# Patient Record
Sex: Male | Born: 1990 | Hispanic: No | Marital: Single | Smoking: Current some day smoker
Health system: Southern US, Community
[De-identification: ages and names within clinical notes are randomized; demographics above are authoritative.]

---
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
## 2012-02-03 ENCOUNTER — Ambulatory Visit (INDEPENDENT_AMBULATORY_CARE_PROVIDER_SITE_OTHER): Payer: Commercial Managed Care - PPO | Admitting: Family Medicine

## 2012-02-03 VITALS — BP 108/65 | HR 59 | Temp 98.0°F | Resp 16 | Ht 72.0 in | Wt 189.0 lb

## 2012-02-03 DIAGNOSIS — T6391XA Toxic effect of contact with unspecified venomous animal, accidental (unintentional), initial encounter: Secondary | ICD-10-CM

## 2012-02-03 DIAGNOSIS — T63301A Toxic effect of unspecified spider venom, accidental (unintentional), initial encounter: Secondary | ICD-10-CM

## 2012-02-03 NOTE — Progress Notes (Signed)
21 year old man who experienced what he thinks is a spider bite several days ago on his right lower extremity. He woke with some itching in the area and had subsequent ulceration in 2 different areas. He appears to be healing now but he was concerned after reading the Internet indication of brown recluse spider bites. RAFA objective  Objective: No acute distress Examination the right lower extremity shows 2 small healing ulcerations with crusting scab on each. These measure approximately 2 x 4 mm in her surrounded by a 2 mm area of erythema without streaking. There is no induration or significant swelling in the area.  Assessment:, Spider bite with normal subsequent healing  Plan: Continue washing with soap and water, if symptoms worsen for any reason instead of the continued healing, start doxycycline 100 twice a day x7

## 2013-10-14 ENCOUNTER — Ambulatory Visit: Payer: Commercial Managed Care - PPO

## 2013-10-14 ENCOUNTER — Ambulatory Visit (INDEPENDENT_AMBULATORY_CARE_PROVIDER_SITE_OTHER): Payer: Commercial Managed Care - PPO | Admitting: Emergency Medicine

## 2013-10-14 VITALS — BP 128/80 | HR 66 | Temp 97.9°F | Resp 16 | Ht 72.0 in | Wt 179.8 lb

## 2013-10-14 DIAGNOSIS — R079 Chest pain, unspecified: Secondary | ICD-10-CM

## 2013-10-14 NOTE — Progress Notes (Signed)
[facility] [address], [place] [address] [phone]  Date:  10/14/2013   Name:  Gary Ball   DOB:  1991/05/31   MRN:  [id number]  PCP:  No primary provider on file.    Chief Complaint: Chest Pain   History of Present Illness:  Gary Ball is a 23 y.o. very pleasant male patient who presents with the following:  Describes infrequent right chest pains says they are occasionally dull.  When he gets the pains, they come in waves and come and go.  Last 3-4 minutes and pass. Not associated with activity, shortness of breath.  No nausea or vomiting.  No wheezing.  No diaphoresis or palpitations or rapid heart rate.  No relation to activity.  No history of injury or overuse.  Occasional smoker, daily marijuana user.  No GI symptoms.  No hypertension, lipid elevation, DM or premature heart disease in family.  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  No improvement with over the counter medications or other home remedies. Denies other complaint or health concern today.   There are no active problems to display for this patient.   History reviewed. No pertinent past medical history.  History reviewed. No pertinent past surgical history.  History  Substance Use Topics  . Smoking status: Current Some Day Smoker -- 0.10 packs/day for 3 years    Types: Cigarettes  . Smokeless tobacco: Not on file  . Alcohol Use: 0.6 oz/week    1 Cans of beer per week    Family History  Problem Relation Age of Onset  . Cancer Maternal Grandfather   . Cancer Paternal Grandfather     No Known Allergies  Medication list has been reviewed and updated.  No current outpatient prescriptions on file prior to visit.   No current facility-administered medications on file prior to visit.    Review of Systems:  As per HPI, otherwise negative.    Physical Examination: Filed Vitals:   10/14/13 1503  BP: 128/80  Pulse: 66  Temp: 97.9 F (36.6 C)  Resp: 16    Filed Vitals:   10/14/13 1503  Height: 6' (1.829 m)  Weight: 179 lb 12.8 oz (81.557 kg)   Body mass index is 24.38 kg/(m^2). Ideal Body Weight: Weight in (lb) to have BMI = 25: 183.9  GEN: WDWN, NAD, Non-toxic, A & O x 3 HEENT: Atraumatic, Normocephalic. Neck supple. No masses, No LAD. Ears and Nose: No external deformity. CV: RRR, No M/G/R. No JVD. No thrill. No extra heart sounds. PULM: CTA B, no wheezes, crackles, rhonchi. No retractions. No resp. distress. No accessory muscle use. ABD: S, NT, ND, +BS. No rebound. No HSM. EXTR: No c/c/e NEURO Normal gait.  PSYCH: Normally interactive. Conversant. Not depressed or anxious appearing.  Calm demeanor.    Assessment and Plan: Atypical chest pain Offered cardiology referral and refused Stop smoking   Signed,  Phillips OdorJeffery Kasiya Burck, MD   [facility] reading (PRIMARY) by  Dr. Dareen PianoAnderson.  Negative chest.

## 2013-10-14 NOTE — Patient Instructions (Signed)
Chest Pain (Nonspecific) °It is often hard to give a specific diagnosis for the cause of chest pain. There is always a chance that your pain could be related to something serious, such as a heart attack or a blood clot in the lungs. You need to follow up with your caregiver for further evaluation. °CAUSES  °· Heartburn. °· Pneumonia or bronchitis. °· Anxiety or stress. °· Inflammation around your heart (pericarditis) or lung (pleuritis or pleurisy). °· A blood clot in the lung. °· A collapsed lung (pneumothorax). It can develop suddenly on its own (spontaneous pneumothorax) or from injury (trauma) to the chest. °· Shingles infection (herpes zoster virus). °The chest wall is composed of bones, muscles, and cartilage. Any of these can be the source of the pain. °· The bones can be bruised by injury. °· The muscles or cartilage can be strained by coughing or overwork. °· The cartilage can be affected by inflammation and become sore (costochondritis). °DIAGNOSIS  °Lab tests or other studies, such as X-rays, electrocardiography, stress testing, or cardiac imaging, may be needed to find the cause of your pain.  °TREATMENT  °· Treatment depends on what may be causing your chest pain. Treatment may include: °· Acid blockers for heartburn. °· Anti-inflammatory medicine. °· Pain medicine for inflammatory conditions. °· Antibiotics if an infection is present. °· You may be advised to change lifestyle habits. This includes stopping smoking and avoiding alcohol, caffeine, and chocolate. °· You may be advised to keep your head raised (elevated) when sleeping. This reduces the chance of acid going backward from your stomach into your esophagus. °· Most of the time, nonspecific chest pain will improve within 2 to 3 days with rest and mild pain medicine. °HOME CARE INSTRUCTIONS  °· If antibiotics were prescribed, take your antibiotics as directed. Finish them even if you start to feel better. °· For the next few days, avoid physical  activities that bring on chest pain. Continue physical activities as directed. °· Do not smoke. °· Avoid drinking alcohol. °· Only take over-the-counter or prescription medicine for pain, discomfort, or fever as directed by your caregiver. °· Follow your caregiver's suggestions for further testing if your chest pain does not go away. °· Keep any follow-up appointments you made. If you do not go to an appointment, you could develop lasting (chronic) problems with pain. If there is any problem keeping an appointment, you must call to reschedule. °SEEK MEDICAL CARE IF:  °· You think you are having problems from the medicine you are taking. Read your medicine instructions carefully. °· Your chest pain does not go away, even after treatment. °· You develop a rash with blisters on your chest. °SEEK IMMEDIATE MEDICAL CARE IF:  °· You have increased chest pain or pain that spreads to your arm, neck, jaw, back, or abdomen. °· You develop shortness of breath, an increasing cough, or you are coughing up blood. °· You have severe back or abdominal pain, feel nauseous, or vomit. °· You develop severe weakness, fainting, or chills. °· You have a fever. °THIS IS AN EMERGENCY. Do not wait to see if the pain will go away. Get medical help at once. Call your local emergency services (911 in [place] Do not drive yourself to the hospital. °MAKE SURE YOU:  °· Understand these instructions. °· Will watch your condition. °· Will get help right away if you are not doing well or get worse. °Document Released: 05/29/2005 Document Revised: 11/11/2011 Document Reviewed: 03/24/2008 °[employer]® Patient Information ©2014 [employer]. ° °

## 2014-07-25 ENCOUNTER — Other Ambulatory Visit: Payer: Self-pay | Admitting: Endocrinology

## 2014-07-25 DIAGNOSIS — G4489 Other headache syndrome: Secondary | ICD-10-CM

## 2014-07-26 ENCOUNTER — Ambulatory Visit
Admission: RE | Admit: 2014-07-26 | Discharge: 2014-07-26 | Disposition: A | Discharge status: Home / Self Care | Payer: Commercial Managed Care - PPO | Source: Ambulatory Visit | Attending: Endocrinology | Admitting: Endocrinology

## 2014-07-26 DIAGNOSIS — G4489 Other headache syndrome: Secondary | ICD-10-CM

## 2014-07-26 MED ORDER — IOHEXOL 300 MG/ML  SOLN
75.0000 mL | Freq: Once | INTRAMUSCULAR | Status: AC | PRN
  Administered 2014-07-26: 75 mL via INTRAVENOUS

## 2016-09-15 IMAGING — CT CT HEAD WO/W CM
1 of 2 series · 13 of 30 positions shown, 17 images · IV contrast (75CC OMNI 300)
Comparison: None.

CLINICAL DATA: Severe headaches.  Blurred vision.

EXAM:
CT HEAD WITHOUT AND WITH CONTRAST
TECHNIQUE: Contiguous axial images were obtained from the base of the skull
through the vertex without and with intravenous contrast
CONTRAST:  75mL OMNIPAQUE IOHEXOL 300 MG/ML  SOLN

[Series 2: head w/o · axial · non-contrast · 0.47mm/px · z∈[+18,+149]mm · 13 of 32 slices shown, 17 images]
[im 3/32  brain]
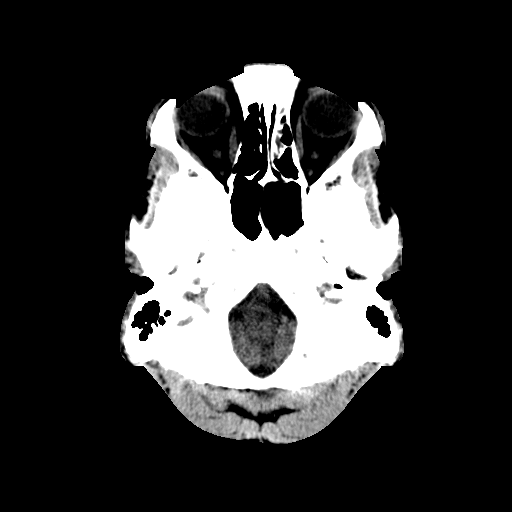
[im 3/32  bone]
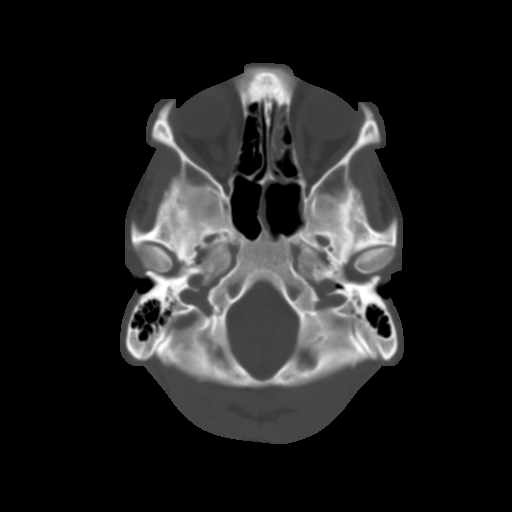
[im 5/32  brain]
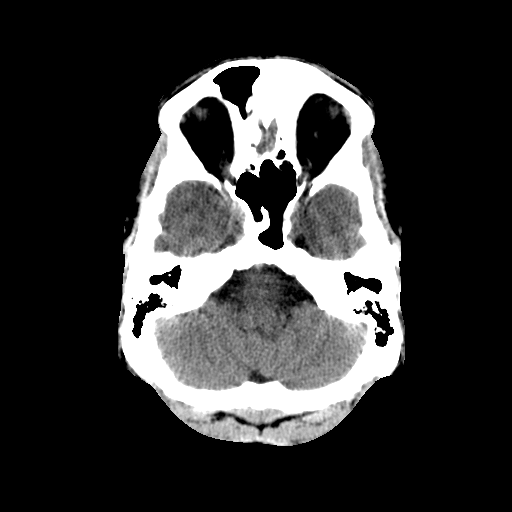
[im 7/32  brain]
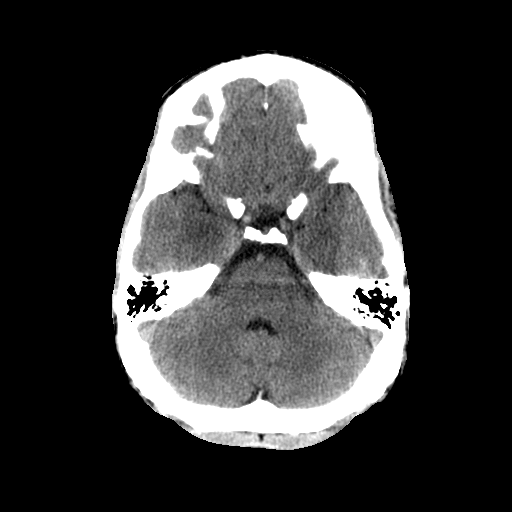
[im 9/32  brain]
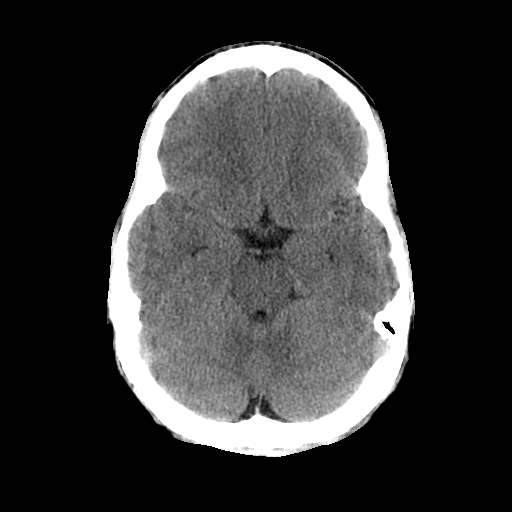
[im 12/32  brain]
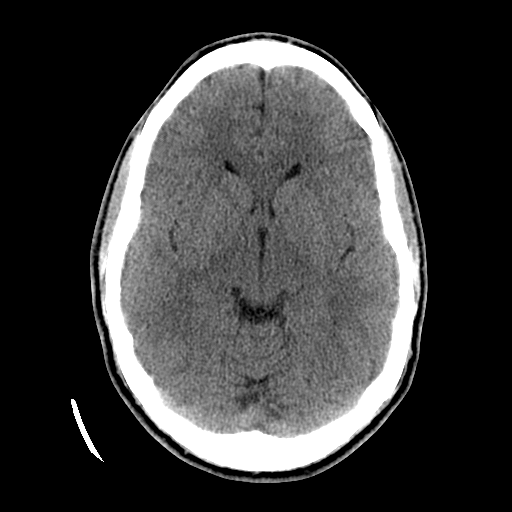
[im 12/32  bone]
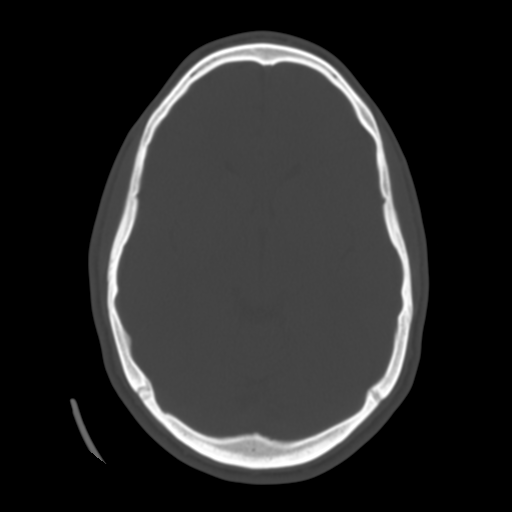
[im 14/32  brain]
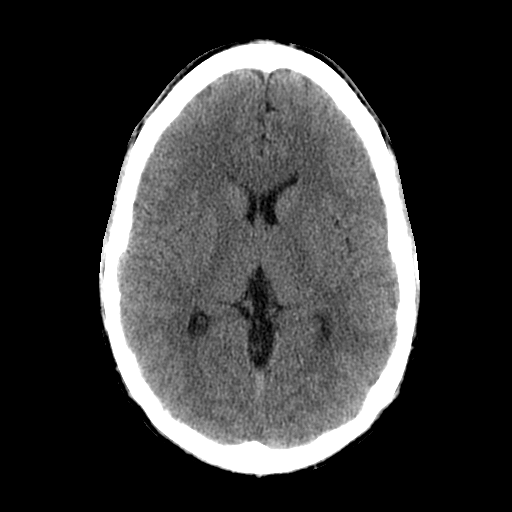
[im 16/32  brain]
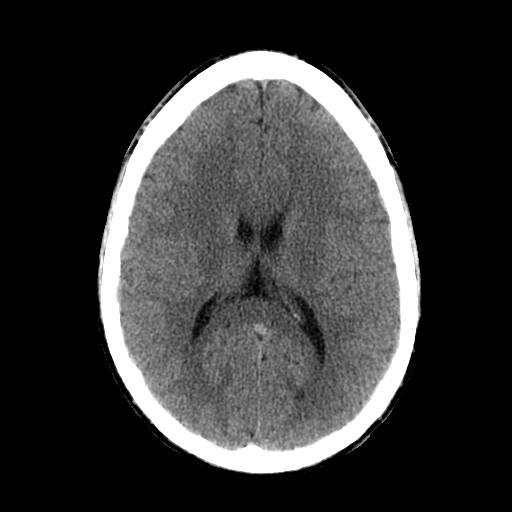
[im 18/32  brain]
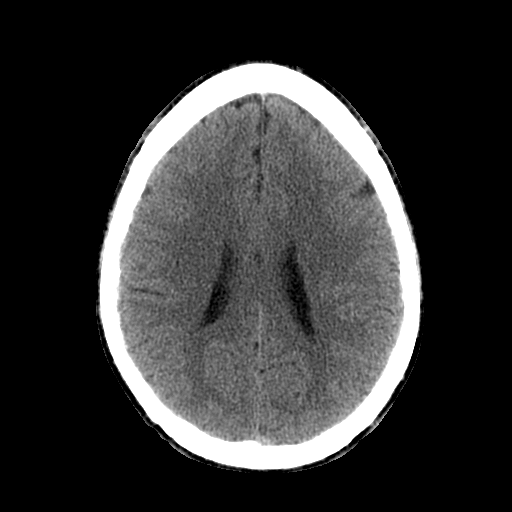
[im 20/32  brain]
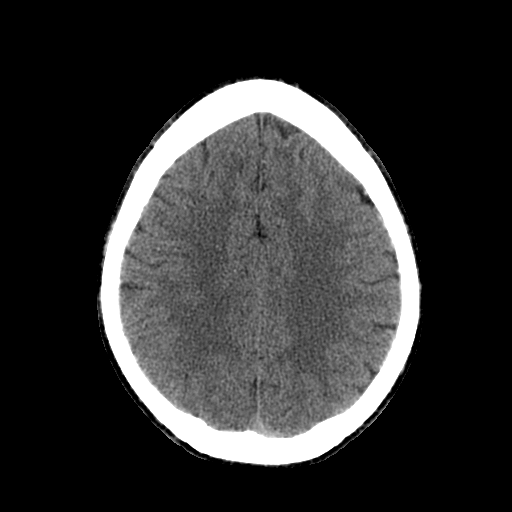
[im 20/32  bone]
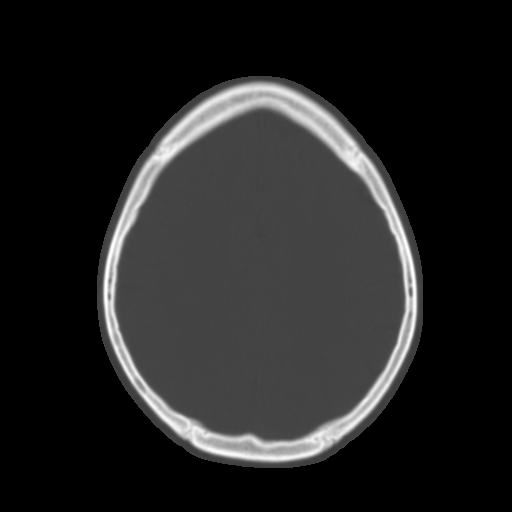
[im 23/32  brain]
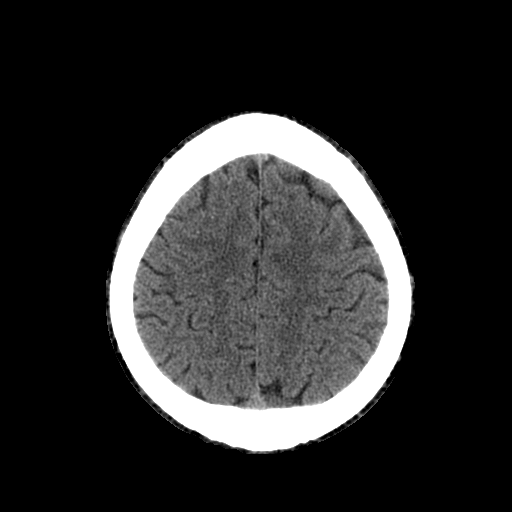
[im 25/32  brain]
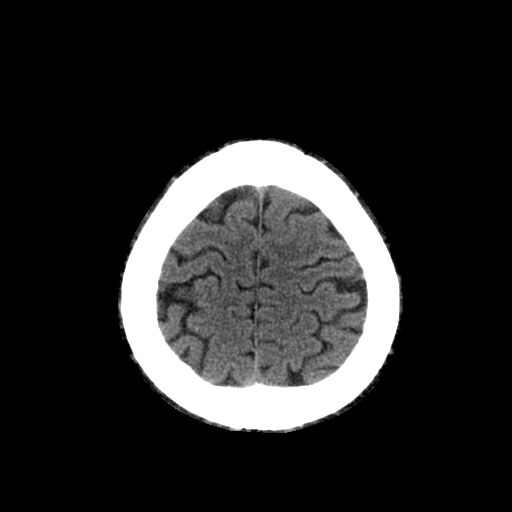
[im 27/32  brain]
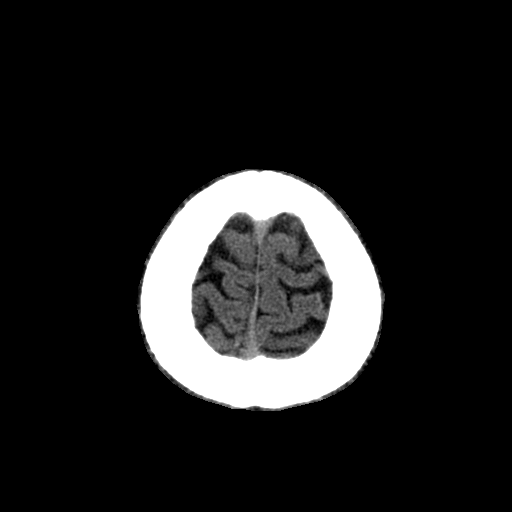
[im 29/32  brain]
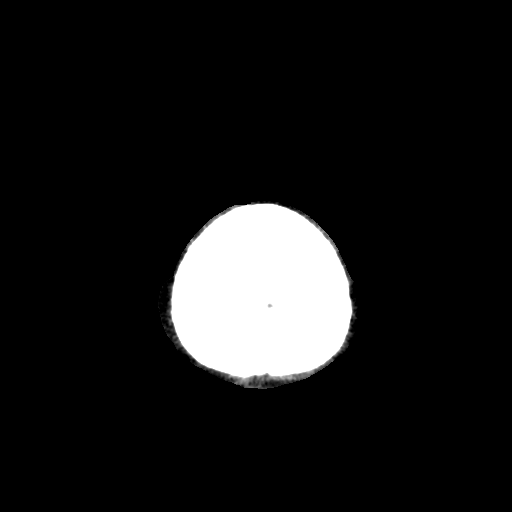
[im 29/32  bone]
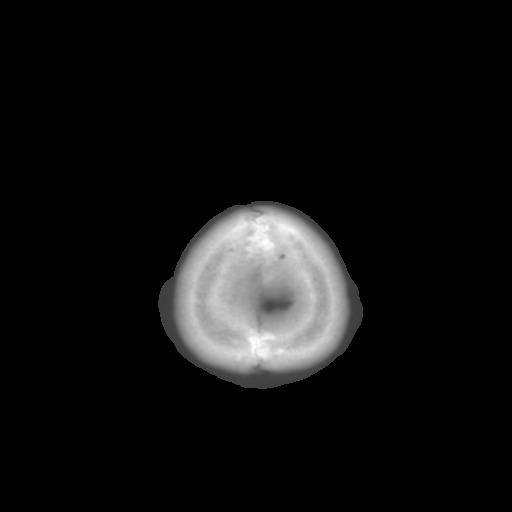

[13 of 30 positions shown; findings below may reference images not displayed]

FINDINGS: Ventricle size is normal. Negative for acute or chronic ischemia.
White matter normal. Negative for hemorrhage or mass.

Normal enhancement following contrast administration.

Normal calvarium. Mild mucosal edema will in the left ethmoid and
sphenoid sinus.
IMPRESSION: Normal CT head with contrast

Mucosal edema in the left frontal and sphenoid sinus.

## 2019-08-30 ENCOUNTER — Ambulatory Visit: Payer: Commercial Managed Care - PPO
